# Patient Record
Sex: Female | Born: 1985 | Race: White | Hispanic: No | Marital: Single | Smoking: Heavy tobacco smoker
Health system: Southern US, Community
[De-identification: ages and names within clinical notes are randomized; demographics above are authoritative.]

---

## 2009-10-22 ENCOUNTER — Emergency Department (HOSPITAL_COMMUNITY): Admission: EM | Admit: 2009-10-22 | Discharge: 2009-10-22 | Payer: Self-pay | Admitting: Emergency Medicine

## 2010-04-28 LAB — POCT I-STAT, CHEM 8
BUN: 10 mg/dL (ref 6–23)
Chloride: 101 mEq/L (ref 96–112)
HCT: 46 % (ref 36.0–46.0)
Sodium: 140 mEq/L (ref 135–145)
TCO2: 28 mmol/L (ref 0–100)

## 2010-04-28 LAB — URINALYSIS, ROUTINE W REFLEX MICROSCOPIC
Glucose, UA: NEGATIVE mg/dL
Hgb urine dipstick: NEGATIVE
Specific Gravity, Urine: 1.026 (ref 1.005–1.030)

## 2010-04-28 LAB — URINE CULTURE

## 2010-04-28 LAB — URINE MICROSCOPIC-ADD ON

## 2010-04-28 LAB — GLUCOSE, CAPILLARY: Glucose-Capillary: 97 mg/dL (ref 70–99)

## 2010-07-05 ENCOUNTER — Emergency Department (HOSPITAL_COMMUNITY)
Admission: EM | Admit: 2010-07-05 | Discharge: 2010-07-06 | Disposition: A | Discharge status: Home / Self Care | Payer: PRIVATE HEALTH INSURANCE | Attending: Emergency Medicine | Admitting: Emergency Medicine

## 2010-07-05 DIAGNOSIS — R109 Unspecified abdominal pain: Secondary | ICD-10-CM | POA: Insufficient documentation

## 2010-07-05 DIAGNOSIS — N898 Other specified noninflammatory disorders of vagina: Secondary | ICD-10-CM | POA: Insufficient documentation

## 2010-07-05 DIAGNOSIS — D72829 Elevated white blood cell count, unspecified: Secondary | ICD-10-CM | POA: Insufficient documentation

## 2010-07-05 LAB — CBC
Hemoglobin: 13.9 g/dL (ref 12.0–15.0)
MCH: 30.9 pg (ref 26.0–34.0)
Platelets: 220 10*3/uL (ref 150–400)
RBC: 4.5 MIL/uL (ref 3.87–5.11)
WBC: 20.6 10*3/uL — ABNORMAL HIGH (ref 4.0–10.5)

## 2010-07-05 LAB — DIFFERENTIAL
Basophils Absolute: 0 10*3/uL (ref 0.0–0.1)
Basophils Relative: 0 % (ref 0–1)
Eosinophils Absolute: 0.1 10*3/uL (ref 0.0–0.7)
Monocytes Relative: 7 % (ref 3–12)
Neutro Abs: 17.5 10*3/uL — ABNORMAL HIGH (ref 1.7–7.7)
Neutrophils Relative %: 85 % — ABNORMAL HIGH (ref 43–77)

## 2010-07-06 ENCOUNTER — Emergency Department (HOSPITAL_COMMUNITY): Payer: PRIVATE HEALTH INSURANCE

## 2010-07-06 ENCOUNTER — Other Ambulatory Visit (HOSPITAL_COMMUNITY): Payer: Self-pay

## 2010-07-06 LAB — URINALYSIS, ROUTINE W REFLEX MICROSCOPIC
Bilirubin Urine: NEGATIVE
Ketones, ur: NEGATIVE mg/dL
Leukocytes, UA: NEGATIVE
Nitrite: NEGATIVE
Protein, ur: NEGATIVE mg/dL
pH: 6.5 (ref 5.0–8.0)

## 2010-07-06 LAB — POCT PREGNANCY, URINE: Preg Test, Ur: NEGATIVE

## 2010-07-06 LAB — BASIC METABOLIC PANEL
CO2: 28 mEq/L (ref 19–32)
Chloride: 98 mEq/L (ref 96–112)
GFR calc Af Amer: >60 mL/min (ref >60)
Potassium: 3.5 mEq/L (ref 3.5–5.1)
Sodium: 136 mEq/L (ref 135–145)

## 2010-07-06 LAB — WET PREP, GENITAL
Clue Cells Wet Prep HPF POC: NONE SEEN
Trich, Wet Prep: NONE SEEN
WBC, Wet Prep HPF POC: NONE SEEN
Yeast Wet Prep HPF POC: NONE SEEN

## 2010-07-06 LAB — URINE MICROSCOPIC-ADD ON

## 2010-07-07 LAB — GC/CHLAMYDIA PROBE AMP, GENITAL: Chlamydia, DNA Probe: POSITIVE — AB

## 2013-12-02 ENCOUNTER — Other Ambulatory Visit: Payer: Self-pay | Admitting: Internal Medicine

## 2013-12-02 DIAGNOSIS — M25561 Pain in right knee: Secondary | ICD-10-CM

## 2013-12-05 ENCOUNTER — Ambulatory Visit
Admission: RE | Admit: 2013-12-05 | Discharge: 2013-12-05 | Disposition: A | Discharge status: Home / Self Care | Payer: BC Managed Care – PPO | Source: Ambulatory Visit | Attending: Internal Medicine | Admitting: Internal Medicine

## 2013-12-05 DIAGNOSIS — M25561 Pain in right knee: Secondary | ICD-10-CM

## 2014-12-30 ENCOUNTER — Encounter: Payer: Self-pay | Admitting: Emergency Medicine

## 2014-12-30 ENCOUNTER — Emergency Department
Admission: EM | Admit: 2014-12-30 | Discharge: 2014-12-30 | Disposition: A | Discharge status: Home / Self Care | Payer: Self-pay | Attending: Emergency Medicine | Admitting: Emergency Medicine

## 2014-12-30 ENCOUNTER — Emergency Department: Payer: Medicaid Other

## 2014-12-30 ENCOUNTER — Emergency Department: Payer: Self-pay

## 2014-12-30 DIAGNOSIS — N12 Tubulo-interstitial nephritis, not specified as acute or chronic: Secondary | ICD-10-CM | POA: Insufficient documentation

## 2014-12-30 DIAGNOSIS — Z792 Long term (current) use of antibiotics: Secondary | ICD-10-CM | POA: Insufficient documentation

## 2014-12-30 DIAGNOSIS — N760 Acute vaginitis: Secondary | ICD-10-CM | POA: Insufficient documentation

## 2014-12-30 DIAGNOSIS — B9689 Other specified bacterial agents as the cause of diseases classified elsewhere: Secondary | ICD-10-CM

## 2014-12-30 DIAGNOSIS — Z3202 Encounter for pregnancy test, result negative: Secondary | ICD-10-CM | POA: Insufficient documentation

## 2014-12-30 DIAGNOSIS — F17200 Nicotine dependence, unspecified, uncomplicated: Secondary | ICD-10-CM | POA: Insufficient documentation

## 2014-12-30 DIAGNOSIS — R1031 Right lower quadrant pain: Secondary | ICD-10-CM

## 2014-12-30 LAB — CBC WITH DIFFERENTIAL/PLATELET
Basophils Absolute: 0 10*3/uL (ref 0–0.1)
Basophils Relative: 0 %
Eosinophils Absolute: 0.1 10*3/uL (ref 0–0.7)
Eosinophils Relative: 0 %
HEMATOCRIT: 42.7 % (ref 35.0–47.0)
HEMOGLOBIN: 13.8 g/dL (ref 12.0–16.0)
LYMPHS ABS: 2 10*3/uL (ref 1.0–3.6)
LYMPHS PCT: 10 %
MCH: 30.1 pg (ref 26.0–34.0)
MCHC: 32.4 g/dL (ref 32.0–36.0)
MCV: 92.7 fL (ref 80.0–100.0)
MONOS PCT: 6 %
Monocytes Absolute: 1.3 10*3/uL — ABNORMAL HIGH (ref 0.2–0.9)
NEUTROS ABS: 17.1 10*3/uL — AB (ref 1.4–6.5)
NEUTROS PCT: 84 %
Platelets: 242 10*3/uL (ref 150–440)
RBC: 4.6 MIL/uL (ref 3.80–5.20)
RDW: 13.6 % (ref 11.5–14.5)
WBC: 20.5 10*3/uL — ABNORMAL HIGH (ref 3.6–11.0)

## 2014-12-30 LAB — COMPREHENSIVE METABOLIC PANEL
ALT: 19 U/L (ref 14–54)
ANION GAP: 7 (ref 5–15)
AST: 21 U/L (ref 15–41)
Albumin: 4.4 g/dL (ref 3.5–5.0)
Alkaline Phosphatase: 69 U/L (ref 38–126)
BILIRUBIN TOTAL: 0.7 mg/dL (ref 0.3–1.2)
BUN: 13 mg/dL (ref 6–20)
CO2: 28 mmol/L (ref 22–32)
Calcium: 9.6 mg/dL (ref 8.9–10.3)
Chloride: 104 mmol/L (ref 101–111)
Creatinine, Ser: 0.9 mg/dL (ref 0.44–1.00)
GLUCOSE: 93 mg/dL (ref 65–99)
POTASSIUM: 4.2 mmol/L (ref 3.5–5.1)
Sodium: 139 mmol/L (ref 135–145)
Total Protein: 7.6 g/dL (ref 6.5–8.1)

## 2014-12-30 LAB — URINALYSIS COMPLETE WITH MICROSCOPIC (ARMC ONLY)
BILIRUBIN URINE: NEGATIVE
Glucose, UA: NEGATIVE mg/dL
NITRITE: POSITIVE — AB
PH: 5 (ref 5.0–8.0)
PROTEIN: 100 mg/dL — AB
SPECIFIC GRAVITY, URINE: 1.015 (ref 1.005–1.030)

## 2014-12-30 LAB — CHLAMYDIA/NGC RT PCR (ARMC ONLY)
Chlamydia Tr: NOT DETECTED
N GONORRHOEAE: NOT DETECTED

## 2014-12-30 LAB — WET PREP, GENITAL
SPERM: NONE SEEN
TRICH WET PREP: NONE SEEN
Yeast Wet Prep HPF POC: NONE SEEN

## 2014-12-30 LAB — PREGNANCY, URINE: PREG TEST UR: NEGATIVE

## 2014-12-30 LAB — LIPASE, BLOOD: Lipase: 31 U/L (ref 11–51)

## 2014-12-30 MED ORDER — METRONIDAZOLE 500 MG PO TABS: 500.0000 mg | ORAL_TABLET | Freq: Three times a day (TID) | ORAL | Status: AC

## 2014-12-30 MED ORDER — CIPROFLOXACIN IN D5W 400 MG/200ML IV SOLN
400.0000 mg | Freq: Once | INTRAVENOUS | Status: AC
  Administered 2014-12-30: 400 mg via INTRAVENOUS
  Filled 2014-12-30 (×2): qty 200

## 2014-12-30 MED ORDER — ONDANSETRON HCL 4 MG/2ML IJ SOLN
4.0000 mg | Freq: Once | INTRAMUSCULAR | Status: AC
  Administered 2014-12-30: 4 mg via INTRAVENOUS
  Filled 2014-12-30: qty 2

## 2014-12-30 MED ORDER — FENTANYL CITRATE (PF) 100 MCG/2ML IJ SOLN
50.0000 ug | Freq: Once | INTRAMUSCULAR | Status: AC
  Administered 2014-12-30: 50 ug via INTRAVENOUS
  Filled 2014-12-30: qty 2

## 2014-12-30 MED ORDER — SODIUM CHLORIDE 0.9 % IV BOLUS (SEPSIS)
1000.0000 mL | Freq: Once | INTRAVENOUS | Status: AC
  Administered 2014-12-30: 1000 mL via INTRAVENOUS

## 2014-12-30 MED ORDER — IOHEXOL 350 MG/ML SOLN
100.0000 mL | Freq: Once | INTRAVENOUS | Status: AC | PRN
  Administered 2014-12-30: 75 mL via INTRAVENOUS
  Filled 2014-12-30: qty 100

## 2014-12-30 MED ORDER — ONDANSETRON 4 MG PO TBDP: 4.0000 mg | ORAL_TABLET | Freq: Three times a day (TID) | ORAL | Status: DC | PRN

## 2014-12-30 MED ORDER — IBUPROFEN 800 MG PO TABS: 800.0000 mg | ORAL_TABLET | Freq: Three times a day (TID) | ORAL | Status: AC | PRN

## 2014-12-30 MED ORDER — CIPROFLOXACIN HCL 500 MG PO TABS: 500.0000 mg | ORAL_TABLET | Freq: Two times a day (BID) | ORAL | Status: AC

## 2014-12-30 MED ORDER — HYDROMORPHONE HCL 1 MG/ML IJ SOLN
0.5000 mg | Freq: Once | INTRAMUSCULAR | Status: AC
  Administered 2014-12-30: 0.5 mg via INTRAVENOUS
  Filled 2014-12-30: qty 1

## 2014-12-30 MED ORDER — IOHEXOL 240 MG/ML SOLN
50.0000 mL | INTRAMUSCULAR | Status: AC
  Administered 2014-12-30: 50 mL via ORAL
  Filled 2014-12-30 (×2): qty 50

## 2014-12-30 NOTE — ED Provider Notes (Signed)
[facility] Emergency Department Provider Note  ____________________________________________  Time seen: Approximately 5:41 PM  I have reviewed the triage vital signs and the nursing notes.   HISTORY  Chief Complaint Abdominal Pain    HPI Maria Graham is a 29 y.o. female , otherwise healthy, presenting with right lower quadrant pain that radiates to the right flank. Patient reports that for the past several days she has had a "dull" pain in the right lower quadrant that is constantly there. She occasionally has radiation into the right flank. She has had nausea without vomiting. She had a single episode of diarrhea yesterday and none today. She denies any fever, chills. Today she noticed that the pain was worse when she urinated, and when she stood up from the toilet she developed diaphoresis and blurred vision and then proceeded to have a syncopal event. She had a brief loss of consciousness without any postictal state. Her mom was able to help her up. She does not have any headache, numbness tickling or weakness. She is no chest pain, shortness of breath, cough or cold symptoms. He was last week and it was a normal period.   History reviewed. No pertinent past medical history.  There are no active problems to display for this patient.   History reviewed. No pertinent past surgical history.  Current Outpatient Rx  Name  Route  Sig  Dispense  Refill  . cephALEXin (KEFLEX) 500 MG capsule   Oral   Take 500 mg by mouth 4 (four) times daily.         . ciprofloxacin (CIPRO) 500 MG tablet   Oral   Take 1 tablet (500 mg total) by mouth 2 (two) times daily.   14 tablet   0   . ibuprofen (ADVIL,MOTRIN) 800 MG tablet   Oral   Take 1 tablet (800 mg total) by mouth every 8 (eight) hours as needed.   20 tablet   0   . metroNIDAZOLE (FLAGYL) 500 MG tablet   Oral   Take 1 tablet (500 mg total) by mouth 3 (three) times daily.   42 tablet   0   . ondansetron  (ZOFRAN ODT) 4 MG disintegrating tablet   Oral   Take 1 tablet (4 mg total) by mouth every 8 (eight) hours as needed for nausea or vomiting.   20 tablet   0     Allergies Keflet  History reviewed. No pertinent family history.  Social History Social History  Substance Use Topics  . Smoking status: Heavy Tobacco Smoker  . Smokeless tobacco: None  . Alcohol Use: None    Review of Systems Constitutional: No fever/chills. No lightheadedness or syncope. Eyes: No visual changes. ENT: No sore throat. Cardiovascular: Denies chest pain, palpitations. Respiratory: Denies shortness of breath.  No cough. Gastrointestinal: positive flank pain and abdominal pain.  Os ifnausea, no vomiting.  No diarrhea.  No constipation. Genitourinary: Negative for dysuria.no change in vaginal discharge. Musculoskeletal: Negative for back pain. Skin: Negative for rash. Neurological: Negative for headaches, focal weakness or numbness.  10-point ROS otherwise negative.  ____________________________________________   PHYSICAL EXAM:  VITAL SIGNS: ED Triage Vitals  Enc Vitals Group     BP 12/30/14 1657 122/77 mmHg     Pulse Rate 12/30/14 1657 82     Resp 12/30/14 1657 16     Temp 12/30/14 1657 98.4 F (36.9 C)     Temp Source 12/30/14 1657 Oral     SpO2 12/30/14 1657 99 %  Weight 12/30/14 1657 115 lb (52.164 kg)     Height 12/30/14 1657  (1.626 m)     Head Cir --      Peak Flow --      Pain Score 12/30/14 1706 2     Pain Loc --      Pain Edu? --      Excl. in GC? --     Constitutional: Alert and oriented. Well appearing and in no acute distress. Answer question appropriately. Eyes: Conjunctivae are normal.  EOMI. Head: Atraumatic. Nose: No congestion/rhinnorhea. Mouth/Throat: Mucous membranes are moist.  Neck: No stridor.  Supple.   Cardiovascular: Normal rate, regular rhythm. No murmurs, rubs or gallops.  Respiratory: Normal respiratory effort.  No retractions. Lungs CTAB.  No  wheezes, rales or ronchi. Gastrointestinal: Soft, nondistended. Positive tenderness to palpation in the suprapubic greater than right lower quadrant region. No flank pain bilaterally. No guarding, rebound, Murphy sign, or peritoneal sign. Genitourinary: normal-appearing external genitalia without lesions. Speculum exam reveals a thick white discharge. Normal-appearing cervix.Bimanual examwithout CMT, adnexal masses or tenderness, positive for suprapubic tenderness without mass. Musculoskeletal: No LE edema.  Neurologic:  Normal speech and language. No gross focal neurologic deficits are appreciated.  Skin:  Skin is warm, dry and intact. No rash noted. Psychiatric: Mood and affect are normal. Speech and behavior are normal.  Normal judgement.  ____________________________________________   LABS (all labs ordered are listed, but only abnormal results are displayed)  Labs Reviewed  WET PREP, GENITAL - Abnormal; Notable for the following:    Clue Cells Wet Prep HPF POC FEW (*)    WBC, Wet Prep HPF POC RARE (*)    All other components within normal limits  URINALYSIS COMPLETEWITH MICROSCOPIC ([facility] ONLY) - Abnormal; Notable for the following:    Color, Urine YELLOW (*)    APPearance CLOUDY (*)    Ketones, ur TRACE (*)    Hgb urine dipstick 3+ (*)    Protein, ur 100 (*)    Nitrite POSITIVE (*)    Leukocytes, UA 3+ (*)    Bacteria, UA RARE (*)    Squamous Epithelial / LPF 0-5 (*)    All other components within normal limits  CBC WITH DIFFERENTIAL/PLATELET - Abnormal; Notable for the following:    WBC 20.5 (*)    Neutro Abs 17.1 (*)    Monocytes Absolute 1.3 (*)    All other components within normal limits  CHLAMYDIA/NGC RT PCR ([facility] ONLY)  COMPREHENSIVE METABOLIC PANEL  LIPASE, BLOOD  PREGNANCY, URINE   ____________________________________________  EKG  Not indicated ____________________________________________  RADIOLOGY  US Transvaginal Non-ob  12/30/2014   CLINICAL DATA:  Right lower quadrant abdominal/pelvic pain for 3 days. EXAM: TRANSABDOMINAL AND TRANSVAGINAL ULTRASOUND OF PELVIS TECHNIQUE: Both transabdominal and transvaginal ultrasound examinations of the pelvis were performed. Transabdominal technique was performed for global imaging of the pelvis including uterus, ovaries, adnexal regions, and pelvic cul-de-sac. It was necessary to proceed with endovaginal exam following the transabdominal exam to visualize the uterus, endometrium, ovaries and adnexa. COMPARISON:  Pelvic ultrasound 07/06/2010 FINDINGS: Uterus Measurements: 6.6 x 2.9 x 5.6 cm, measurements taken transabdominally. No fibroids or other mass visualized. Endometrium Thickness: 10 mm.  No focal abnormality visualized. Right ovary Measurements: 4.0 x 1.8 x 2.4 cm. Normal appearance/no adnexal mass. Blood flow is noted. Left ovary Measurements: 3.3 x 3.3 x 2.4 cm. Dominant follicle measuring 2.6 x 1.6 x 2.3 cm. Blood flow is noted. No adnexal mass. Other findings Trace free fluid.  IMPRESSION: Normal pelvic ultrasound. Electronically Signed   By: Rubye OaksMelanie  Ehinger M.D.   On: 12/30/2014 21:26   [place] Pelvis Complete  12/30/2014  CLINICAL DATA:  Right lower quadrant abdominal/pelvic pain for 3 days. EXAM: TRANSABDOMINAL AND TRANSVAGINAL ULTRASOUND OF PELVIS TECHNIQUE: Both transabdominal and transvaginal ultrasound examinations of the pelvis were performed. Transabdominal technique was performed for global imaging of the pelvis including uterus, ovaries, adnexal regions, and pelvic cul-de-sac. It was necessary to proceed with endovaginal exam following the transabdominal exam to visualize the uterus, endometrium, ovaries and adnexa. COMPARISON:  Pelvic ultrasound 07/06/2010 FINDINGS: Uterus Measurements: 6.6 x 2.9 x 5.6 cm, measurements taken transabdominally. No fibroids or other mass visualized. Endometrium Thickness: 10 mm.  No focal abnormality visualized. Right ovary Measurements: 4.0 x 1.8 x 2.4  cm. Normal appearance/no adnexal mass. Blood flow is noted. Left ovary Measurements: 3.3 x 3.3 x 2.4 cm. Dominant follicle measuring 2.6 x 1.6 x 2.3 cm. Blood flow is noted. No adnexal mass. Other findings Trace free fluid. IMPRESSION: Normal pelvic ultrasound. Electronically Signed   By: Rubye OaksMelanie  Ehinger M.D.   On: 12/30/2014 21:26   Ct Abdomen Pelvis W Contrast  12/30/2014  CLINICAL DATA:  Pt to ER with c/o RLQ abdominal pain that radiates into right flank and up right side of back. Pt states she feels like she may pass out. Pt states this afternoon feels the need to urinate, but cannot. EXAM: CT ABDOMEN AND PELVIS WITH CONTRAST TECHNIQUE: Multidetector CT imaging of the abdomen and pelvis was performed using the standard protocol following bolus administration of intravenous contrast. CONTRAST:  75mL OMNIPAQUE IOHEXOL 350 MG/ML SOLN COMPARISON:  Ultrasound from earlier the same day FINDINGS: Visualized lung bases clear. Unremarkable liver, nondistended gallbladder, spleen, adrenal glands, pancreas, left kidney. Aorta and portal vein unremarkable. Focal wedge-shaped areas of decreased contrast enhancement in the upper and lower poles right kidney. No hydronephrosis. Stomach distended by ingested contrast. Small bowel is nondilated. Normal appendix. The colon is nondilated. Urinary bladder physiologically distended. Low-attenuation in the endometrial cavity. Left ovarian cyst. Trace pelvic free fluid. No free air.  No adenopathy.  Lumbar spine  unremarkable. IMPRESSION: 1. Focal areas of decreased enhancement in the right kidney may represent multifocal pyelonephritis, areas of previous parenchymal insult, less likely infarcts. 2. Normal appendix. Electronically Signed   By: Corlis Leak  Hassell M.D.   On: 12/30/2014 21:57    ____________________________________________   PROCEDURES  Procedure(s) performed: None  Critical Care performed: No ____________________________________________   INITIAL IMPRESSION /  ASSESSMENT AND PLAN / ED COURSE  Pertinent labs & imaging results that were available during my care of the patient were reviewed by me and considered in my medical decision making (see chart for details).  29 y.o. female with right lower quadrant pain radiating to the right flank. On exam she does have some tenderness and I am concerned about other ovarian pathology including cyst, less likely torsion, or possible appendicitis. Consider renal colic given the associated flank pain.   ----------------------------------------- 10:40 PM on 12/30/2014 -----------------------------------------  The patient is feeling much better and is requesting food at this time. Her wet prep does show bacterial vaginosis and I will give her a prescription to treat that. She does appear to have a urinary tract infection and CT scan does show some stranding around the kidney concerning for pyelonephritis which would also correlate to the right flank pain. There are no stones. I will treat her with IV antibiotics here and discharge her home with oral antibiotics  for pyelonephritis. Patient understands follow-up instructions and return precautions.  ____________________________________________  FINAL CLINICAL IMPRESSION(S) / ED DIAGNOSES  Final diagnoses:  Right lower quadrant pain  Bacterial vaginosis  Pyelonephritis      NEW MEDICATIONS STARTED DURING THIS VISIT:  Discharge Medication List as of 12/30/2014 10:19 PM    START taking these medications   Details  ciprofloxacin (CIPRO) 500 MG tablet Take 1 tablet (500 mg total) by mouth 2 (two) times daily., Starting 12/30/2014, Until Sat 01/09/15, Print    ibuprofen (ADVIL,MOTRIN) 800 MG tablet Take 1 tablet (800 mg total) by mouth every 8 (eight) hours as needed., Starting 12/30/2014, Until Discontinued, Print    metroNIDAZOLE (FLAGYL) 500 MG tablet Take 1 tablet (500 mg total) by mouth 3 (three) times daily., Starting 12/30/2014, Until Wed 01/13/15,  Print    ondansetron (ZOFRAN ODT) 4 MG disintegrating tablet Take 1 tablet (4 mg total) by mouth every 8 (eight) hours as needed for nausea or vomiting., Starting 12/30/2014, Until Discontinued, Print         Rockne Menghini, MD 12/30/14 2240

## 2014-12-30 NOTE — Discharge Instructions (Signed)
Please return to the emergency department if he developed worsening pain, fever, inability to keep down fluids, or any other symptoms concerning to you.  Bacterial Vaginosis Bacterial vaginosis is a vaginal infection that occurs when the normal balance of bacteria in the vagina is disrupted. It results from an overgrowth of certain bacteria. This is the most common vaginal infection in women of childbearing age. Treatment is important to prevent complications, especially in pregnant women, as it can cause a premature delivery. CAUSES  Bacterial vaginosis is caused by an increase in harmful bacteria that are normally present in smaller amounts in the vagina. Several different kinds of bacteria can cause bacterial vaginosis. However, the reason that the condition develops is not fully understood. RISK FACTORS Certain activities or behaviors can put you at an increased risk of developing bacterial vaginosis, including:  Having a new sex partner or multiple sex partners.  Douching.  Using an intrauterine device (IUD) for contraception. Women do not get bacterial vaginosis from toilet seats, bedding, swimming pools, or contact with objects around them. SIGNS AND SYMPTOMS  Some women with bacterial vaginosis have no signs or symptoms. Common symptoms include:  Grey vaginal discharge.  A fishlike odor with discharge, especially after sexual intercourse.  Itching or burning of the vagina and vulva.  Burning or pain with urination. DIAGNOSIS  Your health care provider will take a medical history and examine the vagina for signs of bacterial vaginosis. A sample of vaginal fluid may be taken. Your health care provider will look at this sample under a microscope to check for bacteria and abnormal cells. A vaginal pH test may also be done.  TREATMENT  Bacterial vaginosis may be treated with antibiotic medicines. These may be given in the form of a pill or a vaginal cream. A second round of antibiotics  may be prescribed if the condition comes back after treatment. Because bacterial vaginosis increases your risk for sexually transmitted diseases, getting treated can help reduce your risk for chlamydia, gonorrhea, HIV, and herpes. HOME CARE INSTRUCTIONS   Only take over-the-counter or prescription medicines as directed by your health care provider.  If antibiotic medicine was prescribed, take it as directed. Make sure you finish it even if you start to feel better.  Tell all sexual partners that you have a vaginal infection. They should see their health care provider and be treated if they have problems, such as a mild rash or itching.  During treatment, it is important that you follow these instructions:  Avoid sexual activity or use condoms correctly.  Do not douche.  Avoid alcohol as directed by your health care provider.  Avoid breastfeeding as directed by your health care provider. SEEK MEDICAL CARE IF:   Your symptoms are not improving after 3 days of treatment.  You have increased discharge or pain.  You have a fever. MAKE SURE YOU:   Understand these instructions.  Will watch your condition.  Will get help right away if you are not doing well or get worse. FOR MORE INFORMATION  Centers for Disease Control and Prevention, Division of STD Prevention: SolutionApps.co.zawww.cdc.gov/std American Sexual Health Association (ASHA): www.ashastd.org    This information is not intended to replace advice given to you by your health care provider. Make sure you discuss any questions you have with your health care provider.   Document Released: 01/30/2005 Document Revised: 02/20/2014 Document Reviewed: 09/11/2012 Elsevier Interactive Patient Education [employer]2016 Elsevier Inc.

## 2014-12-30 NOTE — ED Notes (Signed)
Pt to ER with c/o RLQ abdominal pain that radiates into right flank and up right side of back.  Pt states she feels like she may pass out.  Pt states this afternoon feels the need to urinate, but cannot.

## 2017-01-07 ENCOUNTER — Encounter: Payer: Self-pay | Admitting: Emergency Medicine

## 2017-01-07 ENCOUNTER — Emergency Department
Admission: EM | Admit: 2017-01-07 | Discharge: 2017-01-08 | Disposition: A | Discharge status: Home / Self Care | Payer: 59 | Attending: Emergency Medicine | Admitting: Emergency Medicine

## 2017-01-07 ENCOUNTER — Other Ambulatory Visit: Payer: Self-pay

## 2017-01-07 DIAGNOSIS — F1994 Other psychoactive substance use, unspecified with psychoactive substance-induced mood disorder: Secondary | ICD-10-CM

## 2017-01-07 DIAGNOSIS — F1010 Alcohol abuse, uncomplicated: Secondary | ICD-10-CM

## 2017-01-07 DIAGNOSIS — F4325 Adjustment disorder with mixed disturbance of emotions and conduct: Secondary | ICD-10-CM

## 2017-01-07 DIAGNOSIS — Z79899 Other long term (current) drug therapy: Secondary | ICD-10-CM | POA: Diagnosis not present

## 2017-01-07 DIAGNOSIS — S61519A Laceration without foreign body of unspecified wrist, initial encounter: Secondary | ICD-10-CM

## 2017-01-07 DIAGNOSIS — F10929 Alcohol use, unspecified with intoxication, unspecified: Secondary | ICD-10-CM | POA: Insufficient documentation

## 2017-01-07 DIAGNOSIS — X789XXA Intentional self-harm by unspecified sharp object, initial encounter: Secondary | ICD-10-CM

## 2017-01-07 DIAGNOSIS — F1094 Alcohol use, unspecified with alcohol-induced mood disorder: Secondary | ICD-10-CM

## 2017-01-07 DIAGNOSIS — F17200 Nicotine dependence, unspecified, uncomplicated: Secondary | ICD-10-CM | POA: Diagnosis not present

## 2017-01-07 DIAGNOSIS — Y908 Blood alcohol level of 240 mg/100 ml or more: Secondary | ICD-10-CM | POA: Insufficient documentation

## 2017-01-07 DIAGNOSIS — R45851 Suicidal ideations: Secondary | ICD-10-CM | POA: Insufficient documentation

## 2017-01-07 DIAGNOSIS — Z7289 Other problems related to lifestyle: Secondary | ICD-10-CM | POA: Insufficient documentation

## 2017-01-07 DIAGNOSIS — F329 Major depressive disorder, single episode, unspecified: Secondary | ICD-10-CM | POA: Diagnosis not present

## 2017-01-07 LAB — COMPREHENSIVE METABOLIC PANEL
ALBUMIN: 4.8 g/dL (ref 3.5–5.0)
ALK PHOS: 75 U/L (ref 38–126)
ALT: 22 U/L (ref 14–54)
ANION GAP: 11 (ref 5–15)
AST: 29 U/L (ref 15–41)
BILIRUBIN TOTAL: 0.3 mg/dL (ref 0.3–1.2)
BUN: 8 mg/dL (ref 6–20)
CALCIUM: 9 mg/dL (ref 8.9–10.3)
CO2: 27 mmol/L (ref 22–32)
CREATININE: 0.87 mg/dL (ref 0.44–1.00)
Chloride: 108 mmol/L (ref 101–111)
GFR calc Af Amer: >60 mL/min (ref >60)
GFR calc non Af Amer: >60 mL/min (ref >60)
GLUCOSE: 114 mg/dL — AB (ref 65–99)
Potassium: 4.2 mmol/L (ref 3.5–5.1)
SODIUM: 146 mmol/L — AB (ref 135–145)
TOTAL PROTEIN: 7.9 g/dL (ref 6.5–8.1)

## 2017-01-07 LAB — CBC
HEMATOCRIT: 41.1 % (ref 35.0–47.0)
Hemoglobin: 13.4 g/dL (ref 12.0–16.0)
MCH: 30.9 pg (ref 26.0–34.0)
MCHC: 32.6 g/dL (ref 32.0–36.0)
MCV: 94.6 fL (ref 80.0–100.0)
Platelets: 315 10*3/uL (ref 150–440)
RBC: 4.34 MIL/uL (ref 3.80–5.20)
RDW: 13.5 % (ref 11.5–14.5)
WBC: 17.3 10*3/uL — ABNORMAL HIGH (ref 3.6–11.0)

## 2017-01-07 LAB — URINALYSIS, COMPLETE (UACMP) WITH MICROSCOPIC
BACTERIA UA: NONE SEEN
BILIRUBIN URINE: NEGATIVE
Glucose, UA: NEGATIVE mg/dL
Hgb urine dipstick: NEGATIVE
KETONES UR: NEGATIVE mg/dL
LEUKOCYTES UA: NEGATIVE
NITRITE: NEGATIVE
PH: 6 (ref 5.0–8.0)
Protein, ur: NEGATIVE mg/dL
RBC / HPF: NONE SEEN RBC/hpf (ref 0–5)
SPECIFIC GRAVITY, URINE: 1.002 — AB (ref 1.005–1.030)
WBC UA: NONE SEEN WBC/hpf (ref 0–5)

## 2017-01-07 LAB — URINE DRUG SCREEN, QUALITATIVE (ARMC ONLY)
Amphetamines, Ur Screen: NOT DETECTED
BARBITURATES, UR SCREEN: NOT DETECTED
BENZODIAZEPINE, UR SCRN: NOT DETECTED
Cannabinoid 50 Ng, Ur ~~LOC~~: NOT DETECTED
Cocaine Metabolite,Ur ~~LOC~~: NOT DETECTED
MDMA (Ecstasy)Ur Screen: NOT DETECTED
METHADONE SCREEN, URINE: NOT DETECTED
OPIATE, UR SCREEN: NOT DETECTED
PHENCYCLIDINE (PCP) UR S: NOT DETECTED
Tricyclic, Ur Screen: NOT DETECTED

## 2017-01-07 LAB — POC URINE PREG, ED: Preg Test, Ur: NEGATIVE

## 2017-01-07 LAB — ACETAMINOPHEN LEVEL: Acetaminophen (Tylenol), Serum: <10 ug/mL — ABNORMAL LOW (ref 10–30)

## 2017-01-07 LAB — SALICYLATE LEVEL: Salicylate Lvl: <7 mg/dL (ref 2.8–30.0)

## 2017-01-07 LAB — ETHANOL: Alcohol, Ethyl (B): 290 mg/dL — ABNORMAL HIGH (ref <10)

## 2017-01-07 MED ORDER — VITAMIN B-1 100 MG PO TABS
100.0000 mg | ORAL_TABLET | Freq: Every day | ORAL | Status: DC
  Administered 2017-01-07 – 2017-01-08 (×2): 100 mg via ORAL
  Filled 2017-01-07 (×2): qty 1

## 2017-01-07 MED ORDER — CHLORDIAZEPOXIDE HCL 25 MG PO CAPS: 25.0000 mg | ORAL_CAPSULE | Freq: Three times a day (TID) | ORAL | Status: DC | PRN

## 2017-01-07 MED ORDER — FOLIC ACID 1 MG PO TABS
1.0000 mg | ORAL_TABLET | Freq: Every day | ORAL | Status: DC
  Administered 2017-01-07 – 2017-01-08 (×2): 1 mg via ORAL
  Filled 2017-01-07 (×2): qty 1

## 2017-01-07 MED ORDER — LORAZEPAM 1 MG PO TABS
1.0000 mg | ORAL_TABLET | Freq: Once | ORAL | Status: AC
  Administered 2017-01-07: 1 mg via ORAL
  Filled 2017-01-07: qty 1

## 2017-01-07 NOTE — BH Assessment (Signed)
Referral information for Psychiatric Hospitalization have been faxed to:    [facility] [phone] or [phone])   [facility]([phone])   [place]([phone])   [phone])   [facility] [phone])   [phone])

## 2017-01-07 NOTE — ED Notes (Signed)
Report given to SOC MD. Computer placed in room and process explained to pt who verbalizes understanding.  
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
## 2017-01-07 NOTE — ED Notes (Signed)
BEHAVIORAL HEALTH ROUNDING  Patient sleeping: No.  Patient alert and oriented: yes  Behavior appropriate: Yes. ; If no, describe:  Nutrition and fluids offered: Yes  Toileting and hygiene offered: Yes  Sitter present: not applicable, Q 15 min safety rounds and observation.  Law enforcement present: Yes ODS  

## 2017-01-07 NOTE — ED Notes (Signed)
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
## 2017-01-07 NOTE — ED Notes (Signed)
SOC computer placed in patient's room. Patient made aware of reason for reevaluation. Patient calm and cooperative at this time.
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
## 2017-01-07 NOTE — ED Notes (Signed)
Patient assigned to appropriate care area.  Introduced self to pt  Patient oriented to unit/care area: Informed her that, for her safety, care areas are designed for safety and monitored by security cameras at all times; and visiting hours explained to patient. Patient verbalizes understanding, and verbal contract for safety obtained  Environment secured  Clothing changed by   1/1 bags of belongings labeled and placed in storage

## 2017-01-07 NOTE — ED Notes (Signed)
[employer] officer Ernest MallickIsley [facility] to [facility].

## 2017-01-07 NOTE — BH Assessment (Signed)
Contacted [facility] to determine if pt can be placed on the unit. Nurse Rayfield Citizenaroline stated that the unit is currently only staffed with two nurses and two techs so they cannot admit anyone; she agreed to contact us if someone is discharged so they have room for an admit.
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
## 2017-01-07 NOTE — ED Notes (Signed)
IVC 

## 2017-01-07 NOTE — ED Notes (Signed)
Patient requesting phone to call mother. Phone given to patient and phone use policy explained. Patient verbalized understanding.
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
## 2017-01-07 NOTE — ED Notes (Signed)
Spoke with patient regarding admission process and regulations. 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 Explained to patient that she would be here until cleared by a psychiatrist and that she was welcome to use the phone to call work if needed. Patient verbalized understanding. Calm, but tearful.

## 2017-01-07 NOTE — ED Notes (Signed)
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
## 2017-01-08 DIAGNOSIS — F4325 Adjustment disorder with mixed disturbance of emotions and conduct: Secondary | ICD-10-CM | POA: Diagnosis not present

## 2017-01-08 DIAGNOSIS — S61519A Laceration without foreign body of unspecified wrist, initial encounter: Secondary | ICD-10-CM

## 2017-01-08 DIAGNOSIS — F1010 Alcohol abuse, uncomplicated: Secondary | ICD-10-CM

## 2017-01-08 DIAGNOSIS — F1994 Other psychoactive substance use, unspecified with psychoactive substance-induced mood disorder: Secondary | ICD-10-CM

## 2017-01-08 NOTE — ED Provider Notes (Signed)
-----------------------------------------   7:40 AM on 01/08/2017 -----------------------------------------   Blood pressure 111/63, pulse 70, temperature 98 F (36.7 C), temperature source Oral, resp. rate 18, height 5\' 4"  (1.626 m), weight 59.4 kg (131 lb), last menstrual period 01/07/2017, SpO2 100 %.  The patient had no acute events since last update.  Calm and cooperative at this time.  Disposition is pending Psychiatry/Behavioral Medicine team recommendations.     Irean HongSung, Nea Gittens J, MD 01/08/17 [phone]

## 2017-01-08 NOTE — ED Notes (Signed)
Called for [facility]'s [facility] to [facility]  [phone]

## 2017-01-08 NOTE — ED Provider Notes (Signed)
The patient has been evaluated at bedside by Dr. Toni Amendlapacs, psychiatry.  Patient is clinically stable.  Not felt to be a danger to self or others.  No SI or Hi.  No indication for inpatient psychiatric admission at this time.  Appropriate for continued outpatient therapy.    Maria Graham, Harman Langhans, MD 01/08/17 1200

## 2017-01-08 NOTE — ED Notes (Signed)
Patient denies pain upon d/c

## 2017-01-08 NOTE — ED Notes (Signed)
Pt d/c home. Pt verbalized understanding of d/c. All belongings return to patient. Pt denies ah/vh/si/and hi prior to discharge. Work note given to pt upon discharge.

## 2017-01-08 NOTE — BHH Counselor (Signed)
Patient has been accepted to [facility].  Patient assigned to room TBD Accepting physician is Dr. Elby Showersaj.  Call report to [phone].  Representative was McKessonJonathan.  ER Staff is aware of it Bonita Quin([facility].; & Everardo PacificKenisha Patient's Nurse)     The pt can arrive after 9AM.

## 2017-01-08 NOTE — BHH Counselor (Signed)
[facility].  Spoke to [place] and he stated there was not an appropriate bed for the pt.

## 2017-01-08 NOTE — ED Notes (Signed)
Report called to Geralynn OchsJadeka, Charity fundraiserN at [facility].
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
## 2017-01-08 NOTE — ED Notes (Signed)
Pt mother came to visit and demanded to speak to the MD. Explain to patient that MD was in an emergency situation. Mother verbalized that "her [facility] has rights [facility] Writer explained that "the patient is under IVC'ed and upon the recommendation of the doctor patient the [facility] was [facility]" When and individual is under IVC order they go by the recommendation of the doctor. The mother stated "she did not care and her daughter will not be going". Will cont to contact MD. Still doing assessment for [facility].
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
## 2017-12-19 ENCOUNTER — Telehealth: Payer: Self-pay | Admitting: Family Medicine

## 2017-12-19 NOTE — Telephone Encounter (Signed)
Patient has a new patient appointment on Friday.  Patient wants to know if she can have a physical done at that visit.  Patient's isn't having any problems.  Patient would like to hear back today.

## 2017-12-19 NOTE — Telephone Encounter (Signed)
Patient notified

## 2017-12-19 NOTE — Telephone Encounter (Signed)
Yes  Please let her know

## 2017-12-21 ENCOUNTER — Encounter (INDEPENDENT_AMBULATORY_CARE_PROVIDER_SITE_OTHER): Payer: Self-pay

## 2017-12-21 ENCOUNTER — Encounter: Payer: Self-pay | Admitting: Family Medicine

## 2017-12-21 ENCOUNTER — Ambulatory Visit (INDEPENDENT_AMBULATORY_CARE_PROVIDER_SITE_OTHER): Payer: 59 | Admitting: Family Medicine

## 2017-12-21 ENCOUNTER — Other Ambulatory Visit (HOSPITAL_COMMUNITY)
Admission: RE | Admit: 2017-12-21 | Discharge: 2017-12-21 | Disposition: A | Discharge status: Home / Self Care | Payer: 59 | Source: Ambulatory Visit | Attending: Family Medicine | Admitting: Family Medicine

## 2017-12-21 VITALS — BP 110/70 | HR 95 | Ht 63.0 in | Wt 132.8 lb

## 2017-12-21 DIAGNOSIS — Z113 Encounter for screening for infections with a predominantly sexual mode of transmission: Secondary | ICD-10-CM

## 2017-12-21 DIAGNOSIS — R5383 Other fatigue: Secondary | ICD-10-CM | POA: Diagnosis not present

## 2017-12-21 DIAGNOSIS — Z0000 Encounter for general adult medical examination without abnormal findings: Secondary | ICD-10-CM | POA: Diagnosis not present

## 2017-12-21 DIAGNOSIS — Z124 Encounter for screening for malignant neoplasm of cervix: Secondary | ICD-10-CM | POA: Insufficient documentation

## 2017-12-21 DIAGNOSIS — Z23 Encounter for immunization: Secondary | ICD-10-CM | POA: Diagnosis not present

## 2017-12-21 DIAGNOSIS — R87610 Atypical squamous cells of undetermined significance on cytologic smear of cervix (ASC-US): Secondary | ICD-10-CM
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
## 2017-12-21 NOTE — Patient Instructions (Signed)
Good to see you today, I will notify you of lab results via your Mychart account  Consider adding yoga to your exercise regimen     Mindfulness-Based Stress Reduction Mindfulness-based stress reduction (MBSR) is a program that helps people learn to practice mindfulness. Mindfulness is the practice of intentionally paying attention to the present moment. It can be learned and practiced through techniques such as education, breathing exercises, meditation, and yoga. MBSR includes several mindfulness techniques in one program. MBSR works best when you understand the treatment, are willing to try new things, and can commit to spending time practicing what you learn. MBSR training may include learning about:  How your emotions, thoughts, and reactions affect your body.  New ways to respond to things that cause negative thoughts to start (triggers).  How to notice your thoughts and let go of them.  Practicing awareness of everyday things that you normally do without thinking.  The techniques and goals of different types of meditation.  What are the benefits of MBSR? MBSR can have many benefits, which include helping you to:  Develop self-awareness. This refers to knowing and understanding yourself.  Learn skills and attitudes that help you to participate in your own health care.  Learn new ways to care for yourself.  Be more accepting about how things are, and let things go.  Be less judgmental and approach things with an open mind.  Be patient with yourself and trust yourself more.  MBSR has also been shown to:  Reduce negative emotions, such as depression and anxiety.  Improve memory and focus.  Change how you sense and approach pain.  Boost your body's ability to fight infections.  Help you connect better with other people.  Improve your sense of well-being.  Follow these instructions at home:  Find a local in-person or online MBSR program.  Set aside some time  regularly for mindfulness practice.  Find a mindfulness practice that works best for you. This may include one or more of the following: ? Meditation. Meditation involves focusing your mind on a certain thought or activity. ? Breathing awareness exercises. These help you to stay present by focusing on your breath. ? Body scan. For this practice, you lie down and pay attention to each part of your body from head to toe. You can identify tension and soreness and intentionally relax parts of your body. ? Yoga. Yoga involves stretching and breathing, and it can improve your ability to move and be flexible. It can also provide an experience of testing your body's limits, which can help you release stress. ? Mindful eating. This way of eating involves focusing on the taste, texture, color, and smell of each bite of food. Because this slows down eating and helps you feel full sooner, it can be an important part of a weight-loss plan.  Find a podcast or recording that provides guidance for breathing awareness, body scan, or meditation exercises. You can listen to these any time when you have a free moment to rest without distractions.  Follow your treatment plan as told by your health care provider. This may include taking regular medicines and making changes to your diet or lifestyle as recommended. How to practice mindfulness To do a basic awareness exercise:  Find a comfortable place to sit.  Pay attention to the present moment. Observe your thoughts, feelings, and surroundings just as they are.  Avoid placing judgment on yourself, your feelings, or your surroundings. Make note of any judgment that comes up,  and let it go.  Your mind may wander, and that is okay. Make note of when your thoughts drift, and return your attention to the present moment.  To do basic mindfulness meditation:  Find a comfortable place to sit. This may include a stable chair or a firm floor cushion. ? Sit upright with  your back straight. Let your arms fall next to your side with your hands resting on your legs. ? If sitting in a chair, rest your feet flat on the floor. ? If sitting on a cushion, cross your legs in front of you.  Keep your head in a neutral position with your chin dropped slightly. Relax your jaw and rest the tip of your tongue on the roof of your mouth. Drop your gaze to the floor. You can close your eyes if you like.  Breathe normally and pay attention to your breath. Feel the air moving in and out of your nose. Feel your belly expanding and relaxing with each breath.  Your mind may wander, and that is okay. Make note of when your thoughts drift, and return your attention to your breath.  Avoid placing judgment on yourself, your feelings, or your surroundings. Make note of any judgment or feelings that come up, let them go, and bring your attention back to your breath.  When you are ready, lift your gaze or open your eyes. Pay attention to how your body feels after the meditation.  Where to find more information: You can find more information about MBSR from:  Your health care provider.  Community-based meditation centers or programs.  Programs offered near you.  Summary  Mindfulness-based stress reduction (MBSR) is a program that teaches you how to intentionally pay attention to the present moment. It is used with other treatments to help you cope better with daily stress, emotions, and pain.  MBSR focuses on developing self-awareness, which allows you to respond to life stress without judgment or negative emotions.  MBSR programs may involve learning different mindfulness practices, such as breathing exercises, meditation, yoga, body scan, or mindful eating. Find a mindfulness practice that works best for you, and set aside time for it on a regular basis. This information is not intended to replace advice given to you by your health care provider. Make sure you discuss any  questions you have with your health care provider. Document Released: 06/08/2016 Document Revised: 06/08/2016 Document Reviewed: 06/08/2016 Elsevier Interactive Patient Education  2018 [employer]. Keeping You Healthy  Get These Tests 1. Blood Pressure- Have your blood pressure checked once a year by your health care provider.  Normal blood pressure is 120/80. 2. Weight- Have your body mass index (BMI) calculated to screen for obesity.  BMI is measure of body fat based on height and weight.  You can also calculate your own BMI at [email or web address]-[email or web address] 3. Cholesterol- Have your cholesterol checked every 5 years starting at age 32 then yearly starting at age 70. 4. Chlamydia, HIV, and other sexually transmitted diseases- Get screened every year until age 75, then within three months of each new sexual provider. 5. Pap Test - Every 1-5 years; discuss with your health care provider. 6. Mammogram- Every 1-2 years starting at age 11--50  Take these medicines  Calcium with Vitamin D-Your body needs 1200 mg of Calcium each day and [phone] IU of Vitamin D daily.  Your body can only absorb 500 mg of Calcium at a time so Calcium must be taken in 2 or 3 divided doses  throughout the day.  Multivitamin with folic acid- Once daily if it is possible for you to become pregnant.  Get these Immunizations  Gardasil-Series of three doses; prevents HPV related illness such as genital warts and cervical cancer.  Menactra-Single dose; prevents meningitis.  Tetanus shot- Every 10 years.  Flu shot-Every year.  Take these steps 1. Do not smoke-Your healthcare provider can help you quit.  For tips on how to quit go to www.smokefree.gov or call 1-800 QUITNOW. 2. Be physically active- Exercise 5 days a week for at least 30 minutes.  If you are not already physically active, start slow and gradually work up to 30 minutes of moderate physical activity.  Examples of moderate activity include walking briskly,  dancing, swimming, bicycling, etc. 3. Breast Cancer- A self breast exam every month is important for early detection of breast cancer.  For more information and instruction on self breast exams, ask your healthcare provider or [email or web address]. 4. Eat a healthy diet- Eat a variety of healthy foods such as fruits, vegetables, whole grains, low fat milk, low fat cheeses, yogurt, lean meats, poultry and fish, beans, nuts, tofu, etc.  For more information go to www. Thenutritionsource.org 5. Drink alcohol in moderation- Limit alcohol intake to one drink or less per day. Never drink and drive. 6. Depression- Your emotional health is as important as your physical health.  If you're feeling down or losing interest in things you normally enjoy please talk to your healthcare provider about being screened for depression. 7. Dental visit- Brush and floss your teeth twice daily; visit your dentist twice a year. 8. Eye doctor- Get an eye exam at least every 2 years. 9. Helmet use- Always wear a helmet when riding a bicycle, motorcycle, rollerblading or skateboarding. 10. Safe sex- If you may be exposed to sexually transmitted infections, use a condom. 11. Seat belts- Seat belts can save your live; always wear one. 12. Smoke/Carbon Monoxide detectors- These detectors need to be installed on the appropriate level of your home. Replace batteries at least once a year. 13. Skin cancer- When out in the sun please cover up and use sunscreen 15 SPF or higher. 14. Violence- If anyone is threatening or hurting you, please tell your healthcare provider.

## 2017-12-24 ENCOUNTER — Other Ambulatory Visit: Payer: Self-pay | Admitting: Family Medicine

## 2017-12-24 LAB — COMPREHENSIVE METABOLIC PANEL
A/G RATIO: 2.1 (ref 1.2–2.2)
ALK PHOS: 64 IU/L (ref 39–117)
ALT: 15 IU/L (ref 0–32)
AST: 20 IU/L (ref 0–40)
Albumin: 4.9 g/dL (ref 3.5–5.5)
BUN/Creatinine Ratio: 6 — ABNORMAL LOW (ref 9–23)
BUN: 5 mg/dL — ABNORMAL LOW (ref 6–20)
CHLORIDE: 106 mmol/L (ref 96–106)
CO2: 23 mmol/L (ref 20–29)
Calcium: 10.2 mg/dL (ref 8.7–10.2)
Creatinine, Ser: 0.84 mg/dL (ref 0.57–1.00)
GFR calc non Af Amer: 94 mL/min/{1.73_m2} (ref >59)
GFR, EST AFRICAN AMERICAN: 108 mL/min/{1.73_m2} (ref >59)
Globulin, Total: 2.3 g/dL (ref 1.5–4.5)
Glucose: 83 mg/dL (ref 65–99)
POTASSIUM: 4.6 mmol/L (ref 3.5–5.2)
Sodium: 144 mmol/L (ref 134–144)
TOTAL PROTEIN: 7.2 g/dL (ref 6.0–8.5)

## 2017-12-24 LAB — CBC WITH DIFFERENTIAL/PLATELET
BASOS ABS: 0.1 10*3/uL (ref 0.0–0.2)
BASOS: 0 %
EOS (ABSOLUTE): 0.1 10*3/uL (ref 0.0–0.4)
Eos: 1 %
Hematocrit: 39.6 % (ref 34.0–46.6)
Hemoglobin: 13.1 g/dL (ref 11.1–15.9)
IMMATURE GRANS (ABS): 0 10*3/uL (ref 0.0–0.1)
Immature Granulocytes: 0 %
LYMPHS ABS: 2.3 10*3/uL (ref 0.7–3.1)
Lymphs: 17 %
MCH: 30.4 pg (ref 26.6–33.0)
MCHC: 33.1 g/dL (ref 31.5–35.7)
MCV: 92 fL (ref 79–97)
MONOS ABS: 0.9 10*3/uL (ref 0.1–0.9)
Monocytes: 7 %
NEUTROS ABS: 10.1 10*3/uL — AB (ref 1.4–7.0)
Neutrophils: 75 %
PLATELETS: 273 10*3/uL (ref 150–450)
RBC: 4.31 x10E6/uL (ref 3.77–5.28)
RDW: 11.7 % — AB (ref 12.3–15.4)
WBC: 13.6 10*3/uL — ABNORMAL HIGH (ref 3.4–10.8)

## 2017-12-24 LAB — CYTOLOGY - PAP
Adequacy: ABSENT — AB
BACTERIAL VAGINITIS: POSITIVE — AB
CANDIDA VAGINITIS: NEGATIVE
CHLAMYDIA, DNA PROBE: NEGATIVE
DIAGNOSIS: UNDETERMINED — AB
HPV 16/18/45 genotyping: NEGATIVE
HPV: DETECTED — AB
NEISSERIA GONORRHEA: NEGATIVE
Trichomonas: NEGATIVE

## 2017-12-24 LAB — HIV ANTIBODY (ROUTINE TESTING W REFLEX): HIV Screen 4th Generation wRfx: NONREACTIVE

## 2017-12-24 LAB — VITAMIN D 25 HYDROXY (VIT D DEFICIENCY, FRACTURES): VIT D 25 HYDROXY: 33.9 ng/mL (ref 30.0–100.0)

## 2017-12-24 LAB — TSH: TSH: 1.58 u[IU]/mL (ref 0.450–4.500)

## 2017-12-24 LAB — RPR: RPR Ser Ql: NONREACTIVE

## 2017-12-24 LAB — FERRITIN: Ferritin: 22 ng/mL (ref 15–150)

## 2017-12-26 NOTE — Addendum Note (Signed)
Addended by: Olean ReeGESSNER, DEBORAH B on: 12/26/2017 03:27 PM   Modules accepted: Orders

## 2018-01-23 ENCOUNTER — Telehealth: Payer: Self-pay

## 2018-01-23 NOTE — Telephone Encounter (Signed)
Left message for patient to call back in regards to a referral   

## 2018-01-29 NOTE — Telephone Encounter (Signed)
Left message asking pt to call office regarding referral °

## 2019-06-19 ENCOUNTER — Other Ambulatory Visit: Payer: Self-pay

## 2019-06-19 ENCOUNTER — Ambulatory Visit (INDEPENDENT_AMBULATORY_CARE_PROVIDER_SITE_OTHER): Payer: Self-pay | Admitting: Dermatology

## 2019-06-19 DIAGNOSIS — L988 Other specified disorders of the skin and subcutaneous tissue: Secondary | ICD-10-CM

## 2019-06-19 NOTE — Progress Notes (Signed)
   Follow-Up Visit   Subjective  Maria Graham is a 34 y.o. 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  The following portions of the chart were reviewed this encounter and updated as appropriate:  Tobacco  Allergies  Meds  Problems  Med Hx  Surg Hx  Fam Hx     Review of Systems:  No other skin or systemic complaints except as noted in HPI or Assessment and Plan.  Objective  Well appearing patient in no apparent distress; mood and affect are within normal limits.  A focused examination was performed including the face. Relevant physical exam findings are noted in the Assessment and Plan.  Objective  Head - Anterior (Face): Rhytides and volume loss.   Images                 Assessment & Plan    Elastosis of skin Head - Anterior (Face)  Botox Injection - Head - Anterior (Face) Location: See attached image  Informed consent: Discussed risks (infection, pain, bleeding, bruising, swelling, allergic reaction, paralysis of nearby muscles, eyelid droop, double vision, neck weakness, difficulty breathing, headache, undesirable cosmetic result, and need for additional treatment) and benefits of the procedure, as well as the alternatives.  Informed consent was obtained.  Preparation: The area was cleansed with alcohol.  Procedure Details:  Botox was injected into the dermis with a 30-gauge needle. Pressure applied to any bleeding. Ice packs offered for swelling.  Lot Number: [id number] Expiration:  08/2021  Total Units Injected:  25  Plan: Patient was instructed to remain upright for 4 hours. Patient was instructed to avoid massaging the face and avoid vigorous exercise for the rest of the day. Tylenol may be used for headache.  Allow 2 weeks before returning to clinic for additional dosing as needed. Patient will call for any problems.   Return in about 4 months (around  10/20/2019).  Maylene Roes, CMA, am acting as scribe for Armida Sans, MD .  Documentation: I have reviewed the above documentation for accuracy and completeness, and I agree with the above.  Armida Sans, MD

## 2019-06-20 ENCOUNTER — Encounter: Payer: Self-pay | Admitting: Dermatology

## 2019-06-23 ENCOUNTER — Telehealth: Payer: Self-pay | Admitting: Family Medicine

## 2019-06-23 NOTE — Telephone Encounter (Signed)
Please call patient and see if she ever followed up from abnormal pap test from 2019? I don't see where anyone was able to get in touch with her about a gyn appointment. If not, please tell her that it is important to follow up abnormal pap with gyn and I am happy to put in another referral. Does she want to go to [place] or [place]?

## 2019-06-30 NOTE — Telephone Encounter (Signed)
ATC, mail box full

## 2019-06-30 NOTE — Telephone Encounter (Signed)
ATC, mail box full 

## 2019-07-02 NOTE — Telephone Encounter (Signed)
Pt states that she never followed up after 2019 She is wanting a referral to a previous GYN  Previously seen by Dr Feliberto Gottron - thinks he is with either [facility] or [facility]  Pt has [employer]

## 2019-07-04 ENCOUNTER — Other Ambulatory Visit: Payer: Self-pay | Admitting: Family Medicine

## 2019-07-04 DIAGNOSIS — R87610 Atypical squamous cells of undetermined significance on cytologic smear of cervix (ASC-US): Secondary | ICD-10-CM

## 2019-07-04 DIAGNOSIS — R87618 Other abnormal cytological findings on specimens from cervix uteri: Secondary | ICD-10-CM

## 2019-07-04 NOTE — Telephone Encounter (Signed)
Referral placed.

## 2019-08-01 ENCOUNTER — Encounter: Payer: Managed Care, Other (non HMO) | Admitting: Family Medicine

## 2019-08-01 DIAGNOSIS — Z0289 Encounter for other administrative examinations: Secondary | ICD-10-CM

## 2019-09-15 ENCOUNTER — Encounter: Payer: Managed Care, Other (non HMO) | Admitting: Family Medicine

## 2019-09-15 DIAGNOSIS — Z0289 Encounter for other administrative examinations: Secondary | ICD-10-CM

## 2019-10-21 ENCOUNTER — Ambulatory Visit: Payer: Managed Care, Other (non HMO) | Admitting: Dermatology

## 2019-10-28 ENCOUNTER — Other Ambulatory Visit: Payer: Self-pay

## 2019-10-28 ENCOUNTER — Ambulatory Visit (INDEPENDENT_AMBULATORY_CARE_PROVIDER_SITE_OTHER): Payer: Self-pay | Admitting: Dermatology

## 2019-10-28 ENCOUNTER — Encounter: Payer: Self-pay | Admitting: Dermatology

## 2019-10-28 DIAGNOSIS — L988 Other specified disorders of the skin and subcutaneous tissue: Secondary | ICD-10-CM

## 2019-10-28 NOTE — Progress Notes (Signed)
   Follow-Up Visit   Subjective  Maria Graham is a 34 y.o. female who presents for the following: Facial Elastosis (patient is here today for Botox injections - she is pleased with the previous amount of units).  The following portions of the chart were reviewed this encounter and updated as appropriate:  Tobacco  Allergies  Meds  Problems  Med Hx  Surg Hx  Fam Hx     Review of Systems:  No other skin or systemic complaints except as noted in HPI or Assessment and Plan.  Objective  Well appearing patient in no apparent distress; mood and affect are within normal limits.  A focused examination was performed including the face. Relevant physical exam findings are noted in the Assessment and Plan.  Objective  Face: Rhytides and volume loss.   Images    Assessment & Plan  Elastosis of skin Face  Botox 25 units injected as marked: - frown complex 20 units - forehead 5 units  Botox Injection - Face Location: See attached image  Informed consent: Discussed risks (infection, pain, bleeding, bruising, swelling, allergic reaction, paralysis of nearby muscles, eyelid droop, double vision, neck weakness, difficulty breathing, headache, undesirable cosmetic result, and need for additional treatment) and benefits of the procedure, as well as the alternatives.  Informed consent was obtained.  Preparation: The area was cleansed with alcohol.  Procedure Details:  Botox was injected into the dermis with a 30-gauge needle. Pressure applied to any bleeding. Ice packs offered for swelling.  Lot Number:  [id number] Expiration:  12/2021  Total Units Injected:  25  Plan: Patient was instructed to remain upright for 4 hours. Patient was instructed to avoid massaging the face and avoid vigorous exercise for the rest of the day. Tylenol may be used for headache.  Allow 2 weeks before returning to clinic for additional dosing as needed. Patient will call for any problems.   Return in about 4  months (around 02/27/2020) for cosmetic - Botox .  Maylene Roes, CMA, am acting as scribe for Armida Sans, MD .  Documentation: I have reviewed the above documentation for accuracy and completeness, and I agree with the above.  Armida Sans, MD

## 2020-02-17 ENCOUNTER — Ambulatory Visit: Payer: Managed Care, Other (non HMO) | Admitting: Dermatology

## 2020-04-27 ENCOUNTER — Encounter: Payer: Self-pay | Admitting: Dermatology

## 2020-04-27 ENCOUNTER — Other Ambulatory Visit: Payer: Self-pay

## 2020-04-27 ENCOUNTER — Ambulatory Visit: Payer: Managed Care, Other (non HMO) | Admitting: Dermatology

## 2020-04-27 ENCOUNTER — Ambulatory Visit (INDEPENDENT_AMBULATORY_CARE_PROVIDER_SITE_OTHER): Payer: Self-pay | Admitting: Dermatology

## 2020-04-27 DIAGNOSIS — L988 Other specified disorders of the skin and subcutaneous tissue: Secondary | ICD-10-CM

## 2020-04-27 NOTE — Progress Notes (Signed)
   Follow-Up Visit   Subjective  Maria Graham is a 35 y.o. female who presents for the following: Facial Elastosis (Patient is here today for Botox injections).  The following portions of the chart were reviewed this encounter and updated as appropriate:   Tobacco  Allergies  Meds  Problems  Med Hx  Surg Hx  Fam Hx     Review of Systems:  No other skin or systemic complaints except as noted in HPI or Assessment and Plan.  Objective  Well appearing patient in no apparent distress; mood and affect are within normal limits.  A focused examination was performed including the face . Relevant physical exam findings are noted in the Assessment and Plan.  Objective  Face: Rhytides and volume loss.   Images    Assessment & Plan  Elastosis of skin Face  Botox injected as marked:  - Frown complex 20 units  - Forehead 5 units  - Lower eyelid 1 unit each side  Pt also concerned about tear trough area and under eye wrinkles  For wrinkles, continue using sunscreen daily and sun glasses. Recommend Alastin eye cream half twice daily. Discussed retinoids, but under and around the eyes can be a difficult location to tolerate due to dryness and irritation.   For best improvement in shadowing under eyes, recommend tear trough fillers - patient has had Restylane fillers in that area in the past, but the most recent time she experienced pain afterwards.   Botox Injection - Face Location: See attached image  Informed consent: Discussed risks (infection, pain, bleeding, bruising, swelling, allergic reaction, paralysis of nearby muscles, eyelid droop, double vision, neck weakness, difficulty breathing, headache, undesirable cosmetic result, and need for additional treatment) and benefits of the procedure, as well as the alternatives.  Informed consent was obtained.  Preparation: The area was cleansed with alcohol.  Procedure Details:  Botox was injected into the dermis with a 30-gauge  needle. Pressure applied to any bleeding. Ice packs offered for swelling.  Lot Number:  [id number] Expiration:  06/2022  Total Units Injected:  27 units  Plan: Patient was instructed to remain upright for 4 hours. Patient was instructed to avoid massaging the face and avoid vigorous exercise for the rest of the day. Tylenol may be used for headache.  Allow 2 weeks before returning to clinic for additional dosing as needed. Patient will call for any problems.   Return in about 3 months (around 07/28/2020) for Botox injections.  Maylene Roes, CMA, am acting as scribe for Darden Dates, MD .  Documentation: I have reviewed the above documentation for accuracy and completeness, and I agree with the above.  Darden Dates, MD

## 2020-08-10 ENCOUNTER — Other Ambulatory Visit: Payer: Self-pay

## 2020-08-10 ENCOUNTER — Ambulatory Visit (INDEPENDENT_AMBULATORY_CARE_PROVIDER_SITE_OTHER): Payer: Self-pay | Admitting: Dermatology

## 2020-08-10 DIAGNOSIS — L988 Other specified disorders of the skin and subcutaneous tissue: Secondary | ICD-10-CM

## 2020-08-10 NOTE — Patient Instructions (Signed)

## 2020-08-10 NOTE — Progress Notes (Signed)
   Follow-Up Visit   Subjective  Maria Graham is a 35 y.o. female who presents for the following: Facial Elastosis (Face, pt presents for Botox today).  The following portions of the chart were reviewed this encounter and updated as appropriate:   Tobacco  Allergies  Meds  Problems  Med Hx  Surg Hx  Fam Hx      Review of Systems:  No other skin or systemic complaints except as noted in HPI or Assessment and Plan.  Objective  Well appearing patient in no apparent distress; mood and affect are within normal limits.  A focused examination was performed including face. Relevant physical exam findings are noted in the Assessment and Plan.  Head - Anterior (Face) Rhytides and volume loss.       Assessment & Plan  Elastosis of skin Head - Anterior (Face)  Botox 27 units injected as marked: - Frown complex 20 units - Forehead 5 units - Lower eyelid 1 unit each side total x 2 units    Intralesional injection - Head - Anterior (Face) Location: Frown complex, forehead, lower eyelids  Informed consent: Discussed risks (infection, pain, bleeding, bruising, swelling, allergic reaction, paralysis of nearby muscles, eyelid droop, double vision, neck weakness, difficulty breathing, headache, undesirable cosmetic result, and need for additional treatment) and benefits of the procedure, as well as the alternatives.  Informed consent was obtained.  Preparation: The area was cleansed with alcohol.  Procedure Details:  Botox was injected into the dermis with a 30-gauge needle. Pressure applied to any bleeding. Ice packs offered for swelling.  Lot Number:  [id number] Expiration:  11/2021  Total Units Injected:  27  Plan: Patient was instructed to remain upright for 4 hours. Patient was instructed to avoid massaging the face and avoid vigorous exercise for the rest of the day. Tylenol may be used for headache.  Allow 2 weeks before returning to clinic for additional dosing as needed.  Patient will call for any problems.   Return for 3-59m Botox.  I, Ardis Rowan, RMA, am acting as scribe for Armida Sans, MD . Documentation: I have reviewed the above documentation for accuracy and completeness, and I agree with the above.  Armida Sans, MD

## 2020-08-21 ENCOUNTER — Encounter: Payer: Self-pay | Admitting: Dermatology

## 2020-08-24 ENCOUNTER — Ambulatory Visit: Payer: Managed Care, Other (non HMO) | Admitting: Dermatology

## 2020-11-09 ENCOUNTER — Ambulatory Visit (INDEPENDENT_AMBULATORY_CARE_PROVIDER_SITE_OTHER): Payer: Self-pay | Admitting: Dermatology

## 2020-11-09 ENCOUNTER — Other Ambulatory Visit: Payer: Self-pay

## 2020-11-09 DIAGNOSIS — L988 Other specified disorders of the skin and subcutaneous tissue: Secondary | ICD-10-CM

## 2020-11-09 NOTE — Patient Instructions (Signed)

## 2020-11-09 NOTE — Progress Notes (Signed)
   Follow-Up Visit   Subjective  Maria Graham is a 35 y.o. female who presents for the following: Facial Elastosis (Botox today).  The following portions of the chart were reviewed this encounter and updated as appropriate:   Tobacco  Allergies  Meds  Problems  Med Hx  Surg Hx  Fam Hx     Review of Systems:  No other skin or systemic complaints except as noted in HPI or Assessment and Plan.  Objective  Well appearing patient in no apparent distress; mood and affect are within normal limits.  A focused examination was performed including face, neck, chest and back. Relevant physical exam findings are noted in the Assessment and Plan.  Head - Anterior (Face) Rhytides and volume loss.       Assessment & Plan  Elastosis of skin Head - Anterior (Face)  Botox - 25 units   Frown Complex 20 units Forehead 5 units  Filling material injection - Head - Anterior (Face) Location: See attached image  Informed consent: Discussed risks (infection, pain, bleeding, bruising, swelling, allergic reaction, paralysis of nearby muscles, eyelid droop, double vision, neck weakness, difficulty breathing, headache, undesirable cosmetic result, and need for additional treatment) and benefits of the procedure, as well as the alternatives.  Informed consent was obtained.  Preparation: The area was cleansed with alcohol.  Procedure Details:  Botox was injected into the dermis with a 30-gauge needle. Pressure applied to any bleeding. Ice packs offered for swelling.  Lot Number:  [id number] Expiration:  05/2022  Total Units Injected:  25  Plan: Patient was instructed to remain upright for 4 hours. Patient was instructed to avoid massaging the face and avoid vigorous exercise for the rest of the day. Tylenol may be used for headache.  Allow 2 weeks before returning to clinic for additional dosing as needed. Patient will call for any problems.   Return for Botox in 3-4 months.  Documentation: I  have reviewed the above documentation for accuracy and completeness, and I agree with the above.  Armida Sans, MD

## 2020-11-11 ENCOUNTER — Encounter: Payer: Self-pay | Admitting: Dermatology

## 2021-02-01 ENCOUNTER — Other Ambulatory Visit: Payer: Self-pay

## 2021-02-01 ENCOUNTER — Ambulatory Visit (INDEPENDENT_AMBULATORY_CARE_PROVIDER_SITE_OTHER): Payer: Self-pay | Admitting: Dermatology

## 2021-02-01 DIAGNOSIS — L988 Other specified disorders of the skin and subcutaneous tissue: Secondary | ICD-10-CM

## 2021-02-01 NOTE — Patient Instructions (Signed)

## 2021-02-01 NOTE — Progress Notes (Signed)
° °  Follow-Up Visit   Subjective  Maria Graham is a 35 y.o. female who presents for the following: Facial Elastosis (Patient is here today for Botox injections.).  The following portions of the chart were reviewed this encounter and updated as appropriate:   Tobacco   Allergies   Meds   Problems   Med Hx   Surg Hx   Fam Hx      Review of Systems:  No other skin or systemic complaints except as noted in HPI or Assessment and Plan.  Objective  Well appearing patient in no apparent distress; mood and affect are within normal limits.  A focused examination was performed including the face. Relevant physical exam findings are noted in the Assessment and Plan.  Face Rhytides and volume loss.       Assessment & Plan  Elastosis of skin Face  Botox 25 units injected as marked: - Frown complex 20 units - Forehead 5 units  Botox Injection - Face Location: See attached image  Informed consent: Discussed risks (infection, pain, bleeding, bruising, swelling, allergic reaction, paralysis of nearby muscles, eyelid droop, double vision, neck weakness, difficulty breathing, headache, undesirable cosmetic result, and need for additional treatment) and benefits of the procedure, as well as the alternatives.  Informed consent was obtained.  Preparation: The area was cleansed with alcohol.  Procedure Details:  Botox was injected into the dermis with a 30-gauge needle. Pressure applied to any bleeding. Ice packs offered for swelling.  Lot Number:  [id number] Expiration:  03/2023  Total Units Injected:  25  Plan: Patient was instructed to remain upright for 4 hours. Patient was instructed to avoid massaging the face and avoid vigorous exercise for the rest of the day. Tylenol may be used for headache.  Allow 2 weeks before returning to clinic for additional dosing as needed. Patient will call for any problems.    Return in about 3 months (around 05/02/2021) for Botox injections.  Maylene Roes, CMA, am acting as scribe for Armida Sans, MD . Documentation: I have reviewed the above documentation for accuracy and completeness, and I agree with the above.  Armida Sans, MD

## 2021-02-12 ENCOUNTER — Encounter: Payer: Self-pay | Admitting: Dermatology

## 2021-05-03 ENCOUNTER — Ambulatory Visit: Payer: Managed Care, Other (non HMO) | Admitting: Dermatology
